# Patient Record
Sex: Female | Born: 1970 | Race: White | Hispanic: No | Marital: Married | Smoking: Never smoker
Health system: Southern US, Community
[De-identification: ages and names within clinical notes are randomized; demographics above are authoritative.]

## PROBLEM LIST (undated history)

## (undated) DIAGNOSIS — I10 Essential (primary) hypertension: Secondary | ICD-10-CM

## (undated) DIAGNOSIS — N200 Calculus of kidney: Secondary | ICD-10-CM

---

## 2002-04-04 ENCOUNTER — Other Ambulatory Visit: Admission: RE | Admit: 2002-04-04 | Discharge: 2002-04-04 | Payer: Self-pay | Admitting: Internal Medicine

## 2002-04-08 ENCOUNTER — Encounter: Payer: Self-pay | Admitting: Internal Medicine

## 2002-04-08 ENCOUNTER — Encounter: Admission: RE | Admit: 2002-04-08 | Discharge: 2002-04-08 | Payer: Self-pay | Admitting: Internal Medicine

## 2003-08-19 ENCOUNTER — Other Ambulatory Visit: Admission: RE | Admit: 2003-08-19 | Discharge: 2003-08-19 | Payer: Self-pay | Admitting: Obstetrics and Gynecology

## 2006-02-14 ENCOUNTER — Ambulatory Visit (HOSPITAL_COMMUNITY): Admission: RE | Admit: 2006-02-14 | Discharge: 2006-02-14 | Payer: Self-pay | Admitting: Obstetrics and Gynecology

## 2006-12-04 ENCOUNTER — Inpatient Hospital Stay (HOSPITAL_COMMUNITY): Admission: AD | Admit: 2006-12-04 | Discharge: 2006-12-04 | Payer: Self-pay | Admitting: *Deleted

## 2006-12-05 ENCOUNTER — Inpatient Hospital Stay (HOSPITAL_COMMUNITY): Admission: RE | Admit: 2006-12-05 | Discharge: 2006-12-07 | Payer: Self-pay | Admitting: Obstetrics & Gynecology

## 2008-01-30 ENCOUNTER — Encounter (INDEPENDENT_AMBULATORY_CARE_PROVIDER_SITE_OTHER): Payer: Self-pay | Admitting: Obstetrics & Gynecology

## 2008-01-30 ENCOUNTER — Ambulatory Visit (HOSPITAL_COMMUNITY): Admission: RE | Admit: 2008-01-30 | Discharge: 2008-01-30 | Payer: Self-pay | Admitting: Obstetrics & Gynecology

## 2009-04-09 ENCOUNTER — Inpatient Hospital Stay (HOSPITAL_COMMUNITY): Admission: AD | Admit: 2009-04-09 | Discharge: 2009-04-09 | Payer: Self-pay | Admitting: Obstetrics & Gynecology

## 2009-04-10 ENCOUNTER — Inpatient Hospital Stay (HOSPITAL_COMMUNITY): Admission: AD | Admit: 2009-04-10 | Discharge: 2009-04-10 | Payer: Self-pay | Admitting: Obstetrics and Gynecology

## 2009-05-26 ENCOUNTER — Ambulatory Visit: Payer: Self-pay | Admitting: Obstetrics & Gynecology

## 2009-05-26 ENCOUNTER — Inpatient Hospital Stay (HOSPITAL_COMMUNITY): Admission: AD | Admit: 2009-05-26 | Discharge: 2009-05-29 | Payer: Self-pay | Admitting: Obstetrics and Gynecology

## 2011-01-28 LAB — CBC
HCT: 36.5 % (ref 36.0–46.0)
HCT: 38.8 % (ref 36.0–46.0)
Hemoglobin: 10.5 g/dL — ABNORMAL LOW (ref 12.0–15.0)
Hemoglobin: 12.6 g/dL (ref 12.0–15.0)
MCV: 95.3 fL (ref 78.0–100.0)
MCV: 96 fL (ref 78.0–100.0)
Platelets: 161 10*3/uL (ref 150–400)
Platelets: 200 10*3/uL (ref 150–400)
RBC: 3.83 MIL/uL — ABNORMAL LOW (ref 3.87–5.11)
RDW: 14 % (ref 11.5–15.5)
RDW: 14.2 % (ref 11.5–15.5)
WBC: 11.7 10*3/uL — ABNORMAL HIGH (ref 4.0–10.5)

## 2011-01-28 LAB — COMPREHENSIVE METABOLIC PANEL
AST: 23 U/L (ref 0–37)
BUN: 5 mg/dL — ABNORMAL LOW (ref 6–23)
CO2: 22 mEq/L (ref 19–32)
Chloride: 108 mEq/L (ref 96–112)
Creatinine, Ser: 0.7 mg/dL (ref 0.4–1.2)
GFR calc Af Amer: >60 mL/min (ref >60)
GFR calc non Af Amer: >60 mL/min (ref >60)
Total Bilirubin: 0.6 mg/dL (ref 0.3–1.2)

## 2011-01-28 LAB — URIC ACID: Uric Acid, Serum: 5.1 mg/dL (ref 2.4–7.0)

## 2011-01-30 LAB — URINE CULTURE: Colony Count: 8000

## 2011-03-07 NOTE — Op Note (Signed)
[name], Jo Hudson              ACCOUNT NO.:  [id number]   MEDICAL RECORD NO.:  [id number]          PATIENT TYPE:  AMB   LOCATION:  SDC                           FACILITY:  [facility]   PHYSICIAN:  Genia Del, M.D.DATE OF BIRTH:  12/25/1970   DATE OF PROCEDURE:  01/30/2008  DATE OF DISCHARGE:                               OPERATIVE REPORT   PREOPERATIVE DIAGNOSIS:  14+ weeks gestation, fetal demise.   POSTOPERATIVE DIAGNOSIS:  14+ weeks gestation, fetal demise.   PROCEDURE:  Dilatation and extraction under ultrasound guidance.   SURGEON:  Genia Del, M.D.   ASSISTANT:  None.   ANESTHESIOLOGIST:  Raul Del, M.D.   PROCEDURE:  Under general anesthesia with endotracheal intubation, the  patient is in lithotomy position.  She is prepped with Betadine on the  abdominal and suprapubic areas and draped as usual.  The bladder is  catheterized.  The vaginal exam reveals an anteverted uterus about 14-15  cm, mobile, no adnexal mass.  The cervix is long and closed.  No vaginal  bleeding.  The speculum is introduced into the vagina.  The anterior lip  of the cervix is grasped with a tenaculum.  A paracervical block is done  with lidocaine 1%, 20 mL total, at 4 and 8 o'clock.  The patient has  received cefotetan 2 grams IV.  We remove the two laminaria and the  sponges.  We verify the dilatation of the cervix, and it is dilated up  to 2 cm already.  The largest dilator passes in and out easily.  We  therefore use a #16 curved suction curette and suction the intrauterine  cavity.  Products of conception are sent both to genetics for  chromosomes and FISH and pathology.  Most of the products of conception  are removed that way.  The fetal parts are counted and complete after  the procedure.  We use the ultrasound to verify the intrauterine cavity.  The line is thin.  No fetal parts or products of conception are left in.  We still use the sharp curette to assure that the  cavity is well emptied  and then go back to remove any blood clots with the suction again.  The  uterus contracts well.  Hemostasis is adequate.  All instruments are  removed.  The estimated blood loss was 100 mL.  The count of instruments  and sponges was complete.   The patient was brought to the recovery room in good stable status.      Genia Del, M.D.  Electronically Signed     ML/MEDQ  D:  01/30/2008  T:  01/30/2008  Job:  [id number]

## 2011-07-18 LAB — CBC
HCT: 38.8
MCHC: 35.8
MCV: 89.5
Platelets: 218
RDW: 13.5

## 2013-03-21 ENCOUNTER — Emergency Department (HOSPITAL_COMMUNITY)
Admission: EM | Admit: 2013-03-21 | Discharge: 2013-03-21 | Disposition: A | Discharge status: Home / Self Care | Payer: BC Managed Care – PPO | Attending: Emergency Medicine | Admitting: Emergency Medicine

## 2013-03-21 ENCOUNTER — Emergency Department (HOSPITAL_COMMUNITY): Payer: BC Managed Care – PPO

## 2013-03-21 ENCOUNTER — Encounter (HOSPITAL_COMMUNITY): Payer: Self-pay | Admitting: Emergency Medicine

## 2013-03-21 DIAGNOSIS — F411 Generalized anxiety disorder: Secondary | ICD-10-CM | POA: Insufficient documentation

## 2013-03-21 DIAGNOSIS — Z87442 Personal history of urinary calculi: Secondary | ICD-10-CM | POA: Insufficient documentation

## 2013-03-21 DIAGNOSIS — R209 Unspecified disturbances of skin sensation: Secondary | ICD-10-CM | POA: Insufficient documentation

## 2013-03-21 DIAGNOSIS — I10 Essential (primary) hypertension: Secondary | ICD-10-CM | POA: Insufficient documentation

## 2013-03-21 DIAGNOSIS — F419 Anxiety disorder, unspecified: Secondary | ICD-10-CM

## 2013-03-21 DIAGNOSIS — R079 Chest pain, unspecified: Secondary | ICD-10-CM

## 2013-03-21 DIAGNOSIS — R0789 Other chest pain: Secondary | ICD-10-CM | POA: Insufficient documentation

## 2013-03-21 HISTORY — DX: Calculus of kidney: N20.0

## 2013-03-21 HISTORY — DX: Essential (primary) hypertension: I10

## 2013-03-21 LAB — COMPREHENSIVE METABOLIC PANEL
ALT: 12 U/L (ref 0–35)
AST: 18 U/L (ref 0–37)
Calcium: 9.5 mg/dL (ref 8.4–10.5)
Creatinine, Ser: 0.78 mg/dL (ref 0.50–1.10)
GFR calc Af Amer: >90 mL/min (ref >90)
Sodium: 139 mEq/L (ref 135–145)
Total Protein: 8.2 g/dL (ref 6.0–8.3)

## 2013-03-21 LAB — CBC
MCH: 32.3 pg (ref 26.0–34.0)
MCHC: 35.9 g/dL (ref 30.0–36.0)
Platelets: 204 10*3/uL (ref 150–400)

## 2013-03-21 LAB — POCT I-STAT TROPONIN I: Troponin i, poc: 0 ng/mL (ref 0.00–0.08)

## 2013-03-21 MED ORDER — NITROGLYCERIN 0.4 MG SL SUBL
0.4000 mg | SUBLINGUAL_TABLET | SUBLINGUAL | Status: DC | PRN
  Administered 2013-03-21: 0.4 mg via SUBLINGUAL

## 2013-03-21 MED ORDER — HYDROXYZINE HCL 25 MG PO TABS: 25.0000 mg | ORAL_TABLET | Freq: Four times a day (QID) | ORAL | Status: DC | PRN

## 2013-03-21 MED ORDER — ASPIRIN 81 MG PO CHEW
324.0000 mg | CHEWABLE_TABLET | Freq: Once | ORAL | Status: AC
  Administered 2013-03-21: 324 mg via ORAL
  Filled 2013-03-21: qty 4

## 2013-03-21 MED ORDER — LORAZEPAM 2 MG/ML IJ SOLN
1.0000 mg | Freq: Once | INTRAMUSCULAR | Status: AC
  Administered 2013-03-21: 1 mg via INTRAVENOUS
  Filled 2013-03-21: qty 1

## 2013-03-21 MED ORDER — LABETALOL HCL 5 MG/ML IV SOLN
20.0000 mg | Freq: Once | INTRAVENOUS | Status: AC
  Administered 2013-03-21: 20 mg via INTRAVENOUS
  Filled 2013-03-21: qty 4

## 2013-03-21 MED ORDER — LISINOPRIL 10 MG PO TABS
10.0000 mg | ORAL_TABLET | Freq: Once | ORAL | Status: AC
  Administered 2013-03-21: 10 mg via ORAL
  Filled 2013-03-21: qty 1

## 2013-03-21 MED ORDER — LISINOPRIL 10 MG PO TABS: 20.0000 mg | ORAL_TABLET | Freq: Every day | ORAL | Status: DC

## 2013-03-21 NOTE — ED Notes (Signed)
Cp and numbness in arms both x a couple of days no nausea or sob

## 2013-03-21 NOTE — ED Notes (Signed)
Pt sts feeling pressure in her throat- sts she gets it when anxious. Airway intact. No difficulty swallowing. Pt HR 123

## 2013-03-21 NOTE — ED Notes (Signed)
Has not been taking her bp meds she staes

## 2013-03-21 NOTE — ED Provider Notes (Signed)
History     CSN: [id number]  Arrival date & time 03/21/13  1111   First MD Initiated Contact with Patient 03/21/13 1121      Chief Complaint  Patient presents with  . Chest Pain    (Consider location/radiation/quality/duration/timing/severity/associated sxs/prior treatment) HPI Comments: Patient comes to the ER for evaluation of chest pain and numbness and tingling in her arms and legs that began 5 days ago. She said she thought she heard a crack pipe carrying a heavy bag the day before. She says that the symptoms have been coming and going. The numbness and tingling in her arms and legs feels like pins and needles. She says that these symptoms come and go. She has been having intermittent pain in the center for test as well. She hasn't noticed any specific shortness of breath. There is no nausea or vomiting. Patient reports that she is very anxious. She has not noticed any specific exacerbating or alleviating factors.  Patient is a 42 y.o. female presenting with chest pain.  Chest Pain Associated symptoms: numbness   Associated symptoms: no shortness of breath     Past Medical History  Diagnosis Date  . Kidney stones   . Hypertension     No past surgical history on file.  No family history on file.  History  Substance Use Topics  . Smoking status: Never Smoker   . Smokeless tobacco: Not on file  . Alcohol Use: Yes    OB History   Grav Para Term Preterm Abortions TAB SAB Ect Mult Living                  Review of Systems  Respiratory: Negative for shortness of breath.   Cardiovascular: Positive for chest pain.  Neurological: Positive for numbness.  Psychiatric/Behavioral: The patient is nervous/anxious.   All other systems reviewed and are negative.    Allergies  Review of patient's allergies indicates no known allergies.  Home Medications  No current outpatient prescriptions on file.  BP 226/140  Pulse 131  Resp 16  SpO2 99%  Physical Exam   Constitutional: She is oriented to person, place, and time. She appears well-developed and well-nourished. No distress.  HENT:  Head: Normocephalic and atraumatic.  Right Ear: Hearing normal.  Left Ear: Hearing normal.  Nose: Nose normal.  Mouth/Throat: Oropharynx is clear and moist and mucous membranes are normal.  Eyes: Conjunctivae and EOM are normal. Pupils are equal, round, and reactive to light.  Neck: Normal range of motion. Neck supple.  Cardiovascular: Regular rhythm, S1 normal and S2 normal.  Exam reveals no gallop and no friction rub.   No murmur heard. Pulmonary/Chest: Effort normal and breath sounds normal. No respiratory distress. She exhibits no tenderness.  Abdominal: Soft. Normal appearance and bowel sounds are normal. There is no hepatosplenomegaly. There is no tenderness. There is no rebound, no guarding, no tenderness at McBurney's point and negative Murphy's sign. No hernia.  Musculoskeletal: Normal range of motion.  Neurological: She is alert and oriented to person, place, and time. She has normal strength. No cranial nerve deficit or sensory deficit. Coordination normal. GCS eye subscore is 4. GCS verbal subscore is 5. GCS motor subscore is 6.  Skin: Skin is warm, dry and intact. No rash noted. No cyanosis.  Psychiatric: Her speech is normal and behavior is normal. Thought content normal. Her mood appears anxious.    ED Course  Procedures (including critical care time)  EKG #1:  Date: 03/21/2013  Rate: 135  Rhythm: sinus tachycardia  QRS Axis: left  Intervals: normal  ST/T Wave abnormalities: normal  Conduction Disutrbances:none  Narrative Interpretation:   Old EKG Reviewed: none available  EKG#2:  Date: 03/21/2013  Rate: 85  Rhythm: normal sinus rhythm  QRS Axis: left  Intervals: normal  ST/T Wave abnormalities: normal  Conduction Disutrbances:none  Narrative Interpretation:   Old EKG Reviewed: tachycardia resolved      Labs Reviewed  CBC -  Abnormal; Notable for the following:    Hemoglobin 16.2 (*)    All other components within normal limits  COMPREHENSIVE METABOLIC PANEL - Abnormal; Notable for the following:    Potassium 3.2 (*)    Glucose, Bld 108 (*)    Alkaline Phosphatase 131 (*)    All other components within normal limits  D-DIMER, QUANTITATIVE  POCT I-STAT TROPONIN I   Dg Chest Portable 1 View  03/21/2013   *RADIOLOGY REPORT*  Clinical Data: Left upper chest pain, lifting injury  CHEST - 1 VIEW  Comparison:  None.  Findings: The heart size and mediastinal contours are within normal limits.  Both lungs are clear.  IMPRESSION: No active disease.   Original Report Authenticated By: Judie Petit. Miles Costain, M.D.     Diagnosis: 1. Uncontrolled hypertension 2. Chest pain 2. Anxiety    MDM  Patient presents to the ER for evaluation of chest pain. Patient has been experiencing intermittent episodes of pain in her chest for 5 days. This has also been associated with numbness, tingling and pain in all of her extremities. There is no unilateral neurologic symptomatology. Patient has a normal neurologic exam, no concern for CVA.  Patient does have a history of hypertension that is currently untreated. She was previously on Prinivil. She presented with significantly elevated blood pressure of 226/140. The patient was very anxious panel. I believe some of her paresthesias are secondary to hyperventilation. Patient was administered nitroglycerin for her blood pressure and Ativan to help with blood pressure as well as anxiety. After these medications pressure was much improved but still elevated. Blood pressure continued to improve after IV labetalol. Repeat evaluation reveals that the patient is currently symptom-free. Repeat EKG shows no evidence of ischemia or infarct, tachycardia has resolved. Patient does report that she has a primary care doctor. Her doctor has left the office, but she can still go back to the office for repeat evaluation. She  will restart her Prinivil and have followup with primary care doctor for a repeat check of blood pressure and further consideration these issues. Return to ER if symptoms worsen.        Gilda Crease, MD 03/21/13 1434

## 2013-03-21 NOTE — ED Notes (Signed)
Portable at bedside 

## 2013-06-30 ENCOUNTER — Emergency Department (HOSPITAL_COMMUNITY)
Admission: EM | Admit: 2013-06-30 | Discharge: 2013-06-30 | Disposition: A | Discharge status: Home / Self Care | Payer: BC Managed Care – PPO | Attending: Emergency Medicine | Admitting: Emergency Medicine

## 2013-06-30 ENCOUNTER — Encounter (HOSPITAL_COMMUNITY): Payer: Self-pay | Admitting: *Deleted

## 2013-06-30 ENCOUNTER — Emergency Department (HOSPITAL_COMMUNITY): Payer: BC Managed Care – PPO

## 2013-06-30 DIAGNOSIS — Z87442 Personal history of urinary calculi: Secondary | ICD-10-CM | POA: Insufficient documentation

## 2013-06-30 DIAGNOSIS — Z79899 Other long term (current) drug therapy: Secondary | ICD-10-CM | POA: Insufficient documentation

## 2013-06-30 DIAGNOSIS — S93409A Sprain of unspecified ligament of unspecified ankle, initial encounter: Secondary | ICD-10-CM | POA: Insufficient documentation

## 2013-06-30 DIAGNOSIS — S92309A Fracture of unspecified metatarsal bone(s), unspecified foot, initial encounter for closed fracture: Secondary | ICD-10-CM | POA: Insufficient documentation

## 2013-06-30 DIAGNOSIS — S92301A Fracture of unspecified metatarsal bone(s), right foot, initial encounter for closed fracture: Secondary | ICD-10-CM

## 2013-06-30 DIAGNOSIS — Y9289 Other specified places as the place of occurrence of the external cause: Secondary | ICD-10-CM | POA: Insufficient documentation

## 2013-06-30 DIAGNOSIS — I10 Essential (primary) hypertension: Secondary | ICD-10-CM | POA: Insufficient documentation

## 2013-06-30 DIAGNOSIS — W108XXA Fall (on) (from) other stairs and steps, initial encounter: Secondary | ICD-10-CM | POA: Insufficient documentation

## 2013-06-30 DIAGNOSIS — S93402A Sprain of unspecified ligament of left ankle, initial encounter: Secondary | ICD-10-CM

## 2013-06-30 DIAGNOSIS — Y9389 Activity, other specified: Secondary | ICD-10-CM | POA: Insufficient documentation

## 2013-06-30 NOTE — ED Notes (Signed)
Pt states she missed a step on her deck this morning and fell, complaining of R ankle pain and L lower leg pain, pt denies hitting head or LOC.

## 2013-06-30 NOTE — ED Provider Notes (Signed)
CSN: [id number]     Arrival date & time 06/30/13  9629 History   First MD Initiated Contact with Patient 06/30/13 [phone]     Chief Complaint  Patient presents with  . Fall  . Ankle Injury    right  . Leg Injury    left   (Consider location/radiation/quality/duration/timing/severity/associated sxs/prior Treatment) HPI Comments: 42 year old female presents the emergency department complaining of right foot, left ankle and left leg pain after missing a step on her deck around 5:45 this morning causing her to fall. Denies hitting her head or loss of consciousness. Pain described as throbbing, worse with pressure or walking, relieved by sitting. She tried taking ibuprofen with some relief. States there is swelling on her right foot and left ankle. Denies numbness or tingling.  Patient is a 42 y.o. female presenting with fall and lower extremity injury. The history is provided by the patient.  Fall Pertinent negatives include no numbness.  Ankle Injury Pertinent negatives include no numbness.    Past Medical History  Diagnosis Date  . Kidney stones   . Hypertension    History reviewed. No pertinent past surgical history. No family history on file. History  Substance Use Topics  . Smoking status: Never Smoker   . Smokeless tobacco: Never Used  . Alcohol Use: Yes   OB History   Grav Para Term Preterm Abortions TAB SAB Ect Mult Living                 Review of Systems  Musculoskeletal:       Positive for right foot, left ankle and leg pain.  Skin: Negative for color change.  Neurological: Negative for numbness.  All other systems reviewed and are negative.    Allergies  Review of patient's allergies indicates no known allergies.  Home Medications   Current Outpatient Rx  Name  Route  Sig  Dispense  Refill  . hydrOXYzine (ATARAX/VISTARIL) 25 MG tablet   Oral   Take 1 tablet (25 mg total) by mouth every 6 (six) hours as needed for anxiety.   20 tablet   0   . ibuprofen  (ADVIL,MOTRIN) 200 MG tablet   Oral   Take 800 mg by mouth every 8 (eight) hours as needed for pain.          Marland Kitchen lisinopril (PRINIVIL,ZESTRIL) 10 MG tablet   Oral   Take 2 tablets (20 mg total) by mouth daily.   30 tablet   0    BP 157/95  Pulse 78  Temp(Src) 98.9 F (37.2 C) (Oral)  Resp 18  SpO2 96%  LMP 06/29/2013 Physical Exam  Nursing note and vitals reviewed. Constitutional: She is oriented to person, place, and time. She appears well-developed and well-nourished. No distress.  HENT:  Head: Normocephalic and atraumatic.  Mouth/Throat: Oropharynx is clear and moist.  Eyes: Conjunctivae are normal.  Neck: Normal range of motion. Neck supple.  Cardiovascular: Normal rate, regular rhythm and normal heart sounds.   Pulses:      Dorsalis pedis pulses are 2+ on the right side, and 2+ on the left side.       Posterior tibial pulses are 2+ on the right side, and 2+ on the left side.  Capillary refill less than 3 seconds.  Pulmonary/Chest: Effort normal and breath sounds normal.  Musculoskeletal: Normal range of motion. She exhibits no edema.  Tender to palpation over the right fourth and fifth metatarsals with mild swelling. No bruising. No right ankle tenderness or  swelling. Full range of motion of right ankle. Tender to palpation over L AITFL with swelling noted laterally. Decreased range of motion of left ankle with flexion and extension limited by pain. Tender to palpation over proximal fibula.  Neurological: She is alert and oriented to person, place, and time.  Skin: Skin is warm and dry. No bruising and no ecchymosis noted. She is not diaphoretic.  Psychiatric: She has a normal mood and affect. Her behavior is normal.    ED Course  Procedures (including critical care time) Labs Review Labs Reviewed - No data to display Imaging Review Dg Tibia/fibula Left  06/30/2013   *RADIOLOGY REPORT*  Clinical Data: Recent traumatic injury with pain  LEFT TIBIA AND FIBULA - 2 VIEW   Comparison: None.  Findings: No acute fracture or dislocation is noted.  No gross soft tissue abnormality is seen.  IMPRESSION: No acute abnormality noted.   Original Report Authenticated By: Alcide Clever, M.D.   Dg Ankle Complete Left  06/30/2013   *RADIOLOGY REPORT*  Clinical Data: Recent leg injury with lateral ankle pain  LEFT ANKLE COMPLETE - 3+ VIEW  Comparison: None.  Findings: Mild soft tissue swelling is noted laterally.  No acute fracture or dislocation is noted.  A small plantar calcaneal spur is seen.  IMPRESSION: No acute abnormality is noted.   Original Report Authenticated By: Alcide Clever, M.D.   Dg Foot Complete Right  06/30/2013   *RADIOLOGY REPORT*  Clinical Data: Recent injury with right foot pain  RIGHT FOOT COMPLETE - 3+ VIEW  Comparison: None.  Findings: There is a mildly displaced fracture of the distal aspect of the fifth metatarsal.  No other fractures are identified.  Mild soft tissue swelling is noted as well.  IMPRESSION: Right fifth metatarsal fracture distally.   Original Report Authenticated By: Alcide Clever, M.D.    MDM   1. Metatarsal fracture, right, closed, initial encounter   2. Ankle sprain, left, initial encounter    Patient with right 5th metatarsal fracture, left ankle sprain. Neurovascularly intact bilaterally. Cam Walker for right foot, Ace wrap for left. Ibuprofen for pain/swelling, patient does not want any stronger pain control. Followup with [facility] orthopedics as she has seen them in the past. Return precautions discussed. Patient states understanding of treatment care plan and is agreeable.     Trevor Mace, PA-C 06/30/13 1105

## 2013-07-09 NOTE — ED Provider Notes (Signed)
Medical screening examination/treatment/procedure(s) were performed by non-physician practitioner and as supervising physician I was immediately available for consultation/collaboration.   Claudean Kinds, MD 07/09/13 [phone]

## 2013-08-28 ENCOUNTER — Other Ambulatory Visit: Payer: Self-pay

## 2013-12-03 ENCOUNTER — Ambulatory Visit (INDEPENDENT_AMBULATORY_CARE_PROVIDER_SITE_OTHER): Payer: BC Managed Care – PPO

## 2013-12-03 VITALS — BP 143/85 | HR 98 | Resp 18

## 2013-12-03 DIAGNOSIS — IMO0002 Reserved for concepts with insufficient information to code with codable children: Secondary | ICD-10-CM

## 2013-12-03 DIAGNOSIS — M79609 Pain in unspecified limb: Secondary | ICD-10-CM

## 2013-12-03 DIAGNOSIS — S92309A Fracture of unspecified metatarsal bone(s), unspecified foot, initial encounter for closed fracture: Secondary | ICD-10-CM

## 2013-12-03 DIAGNOSIS — S42309D Unspecified fracture of shaft of humerus, unspecified arm, subsequent encounter for fracture with routine healing: Secondary | ICD-10-CM

## 2013-12-03 NOTE — Patient Instructions (Signed)
ICE INSTRUCTIONS  Apply ice or cold pack to the affected area at least 3 times a day for 10-15 minutes each time.  You should also use ice after prolonged activity or vigorous exercise.  Do not apply ice longer than 20 minutes at one time.  Always keep a cloth between your skin and the ice pack to prevent burns.  Being consistent and following these instructions will help control your symptoms.  We suggest you purchase a gel ice pack because they are reusable and do bit leak.  Some of them are designed to wrap around the area.  Use the method that works best for you.  Here are some other suggestions for icing.   Use a frozen bag of peas or corn-inexpensive and molds well to your body, usually stays frozen for 10 to 20 minutes.  Wet a towel with cold water and squeeze out the excess until it's damp.  Place in a bag in the freezer for 20 minutes. Then remove and use.  Actually recommended alternating hot and cold therapy apply a warm or hot compresses for 15 minutes then ice pack for 15 minutes repeat this process 2 or 3 times once or twice every day. This stimulates increased blood flow to the area which can also stimulate healing to improve.  Also maintain a stiff soled walking or athletic shoe consider things like a dance toe clogs or new balance or Brooks running shoes with a thick inflexible sole

## 2013-12-03 NOTE — Progress Notes (Signed)
Subjective:    Patient ID: Jo Hudson, female    DOB: 1970/12/12, 43 y.o.   MRN: [id number]  HPI my right foot hurts on the side and has been going since 06-30-13 and missed the last step  and I was seeing [facility]  and they did x-ray and put me in a boot and went to regular doctor and did all the testing and they wanted to put me in a bone stimulator and sore and tender and aches at times and not much swelling    Review of Systems  All other systems reviewed and are negative.       Objective:   Physical Exam Lower extremity objective findings as follows vascular status appears to be intact with pedal pulses palpable DP and PT +2/4 bilateral. Capillary fill time 3-4 seconds all digits skin temperature warm turgor normal there is no edema rubor pallor or varicosities noted. Neurologically epicritic and proprioceptive sensations appear to be intact and symmetric bilateral patient does describe still aching and pain along the fifth metatarsal phalangeal joint area right foot. Orthopedic biomechanical exam reveals rectus foot type mild adductovarus rotation lesser digit contractures there is no pain on the left foot our right foot has some tenderness along the fifth metatarsal neck and head area somewhat is base but not at the styloid process is free of pain. X-rays at this time demonstrate A. Oblique fracture of the metatarsal neck fifth metatarsal. There appears to be some proximal telescoping and minimal shifting over the fracture line appears to be consolidating well there may be some still mild lucency of the fracture site however I do feel it is consolidating although a delayed fashion. Its been more than 4 months since patient's injury patient indicates shoe or boot for 3-4 of those months. She also indicated she was offered a bone stimulator which she declined our wondered if you would be helpful at this time. Also on is any other things that can be done to help healing process.  She has seen has not been put back in a cast which I declined to do at this time as I feel that further create osteopenic change and increase her risks.       Assessment & Plan:  Assessment this time is metatarsal fracture fifth metatarsal right foot with possible delayed union. I do feel there is consolidation probably 70% at this point there is minimal bone callus although there is some proximal shifting or splaying of the metatarsal fracture site. There is no severe rotation component of the capital fragment no significant elevation lateral oblique projections noted I do not see much displacement of the proximal contracture. There still some slight tenderness both on palpation around the fifth met head and some dorsal flexion plantar flexion of the fifth toe. Back her recommendation at this time is to maintain a stiff soled walking or athletic shoe no barefoot no flimsy shoes or flip-flops recommend warm compresses and ice packs to help with the edema and a stability more blood flow to the area to heal bone moderate moderate walking activities are recommended at this time a stiff soled shoe I do not think additional immobilization is necessary. I suggested a bone stimulator may be helpful although at this point she is significantly improved and likely will continue to improve even without bone stimulator at this time. If there is any new exacerbations increasing pain or swelling followup with additional x-rays. Patient is advised the only definitive way to determine adequate healing  would be a CT scan or MRI scan which I suggested she should likely ski permanently and discontinue on a course of gentle protected ambulation and slowly increasing her activities. Also recommended vitamin D and calcium supplementation which can help with bone healing as well.  Harriet Masson DPM

## 2015-11-18 ENCOUNTER — Emergency Department (HOSPITAL_COMMUNITY)
Admission: EM | Admit: 2015-11-18 | Discharge: 2015-11-18 | Disposition: A | Discharge status: Home / Self Care | Payer: BLUE CROSS/BLUE SHIELD | Attending: Emergency Medicine | Admitting: Emergency Medicine

## 2015-11-18 ENCOUNTER — Encounter (HOSPITAL_COMMUNITY): Payer: Self-pay | Admitting: Emergency Medicine

## 2015-11-18 DIAGNOSIS — Y998 Other external cause status: Secondary | ICD-10-CM | POA: Insufficient documentation

## 2015-11-18 DIAGNOSIS — T23002A Burn of unspecified degree of left hand, unspecified site, initial encounter: Secondary | ICD-10-CM | POA: Diagnosis present

## 2015-11-18 DIAGNOSIS — Y9389 Activity, other specified: Secondary | ICD-10-CM | POA: Insufficient documentation

## 2015-11-18 DIAGNOSIS — I10 Essential (primary) hypertension: Secondary | ICD-10-CM | POA: Diagnosis not present

## 2015-11-18 DIAGNOSIS — Z87442 Personal history of urinary calculi: Secondary | ICD-10-CM | POA: Diagnosis not present

## 2015-11-18 DIAGNOSIS — Y9289 Other specified places as the place of occurrence of the external cause: Secondary | ICD-10-CM | POA: Diagnosis not present

## 2015-11-18 DIAGNOSIS — T23102A Burn of first degree of left hand, unspecified site, initial encounter: Secondary | ICD-10-CM | POA: Diagnosis not present

## 2015-11-18 DIAGNOSIS — Z79899 Other long term (current) drug therapy: Secondary | ICD-10-CM | POA: Insufficient documentation

## 2015-11-18 DIAGNOSIS — T300 Burn of unspecified body region, unspecified degree: Secondary | ICD-10-CM

## 2015-11-18 DIAGNOSIS — X12XXXA Contact with other hot fluids, initial encounter: Secondary | ICD-10-CM | POA: Insufficient documentation

## 2015-11-18 MED ORDER — SILVER SULFADIAZINE 1 % EX CREA
TOPICAL_CREAM | Freq: Once | CUTANEOUS | Status: AC
  Administered 2015-11-18: 1 via TOPICAL
  Filled 2015-11-18: qty 50
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
## 2015-11-18 NOTE — Discharge Instructions (Signed)
Burn Care °Your skin is a natural barrier to infection. It is the largest organ of your body. Burns damage this natural protection. To help prevent infection, it is very important to follow your caregiver's instructions in the care of your burn. °Burns are classified as: °· First degree. There is only redness of the skin (erythema). No scarring is expected. °· Second degree. There is blistering of the skin. Scarring may occur with deeper burns. °· Third degree. All layers of the skin are injured, and scarring is expected. °HOME CARE INSTRUCTIONS  °· Wash your hands well before changing your bandage. °· Change your bandage as often as directed by your caregiver. °¨ Remove the old bandage. If the bandage sticks, you may soak it off with cool, clean water. °¨ Cleanse the burn thoroughly but gently with mild soap and water. °¨ Pat the area dry with a clean, dry cloth. °¨ Apply a thin layer of antibacterial cream to the burn. °¨ Apply a clean bandage as instructed by your caregiver. °¨ Keep the bandage as clean and dry as possible. °· Elevate the affected area for the first 24 hours, then as instructed by your caregiver. °· Only take over-the-counter or prescription medicines for pain, discomfort, or fever as directed by your caregiver. °SEEK IMMEDIATE MEDICAL CARE IF:  °· You develop excessive pain. °· You develop redness, tenderness, swelling, or red streaks near the burn. °· The burned area develops yellowish-white fluid (pus) or a bad smell. °· You have a fever. °MAKE SURE YOU:  °· Understand these instructions. °· Will watch your condition. °· Will get help right away if you are not doing well or get worse. °  °This information is not intended to replace advice given to you by your health care provider. Make sure you discuss any questions you have with your health care provider. °  °Document Released: 10/09/2005 Document Revised: 01/01/2012 Document Reviewed: 03/01/2011 °Elsevier Interactive Patient Education ©2016  [employer] ° °

## 2015-11-18 NOTE — ED Provider Notes (Signed)
CSN: [id number]     Arrival date & time 11/18/15  4098 History   First MD Initiated Contact with Patient 11/18/15 1118     Chief Complaint  Patient presents with  . Hand Burn   HPI   45 year old female presents today with a burn to her left hand. Patient reports that on Sunday ( 5 days ago) she spilled hot chili on her hand. She reports at that time she just experience pain and redness, with subsequent development of clear fluid-filled esters over the next several days. She reports some initial swelling, which has improved day today. She denies any circumferential burn, inability to flex or extend fingers, worsening redness, signs of infection. She reports pain is manageable with over-the-counter medications, has been applying bacitracin. She denies any significant chronic medical history including diabetes, hypertension, skin disorders. No neurological deficits to the surrounding tissue.  Past Medical History  Diagnosis Date  . Kidney stones   . Hypertension    History reviewed. No pertinent past surgical history. No family history on file. Social History  Substance Use Topics  . Smoking status: Never Smoker   . Smokeless tobacco: Never Used  . Alcohol Use: Yes   OB History    No data available     Review of Systems  All other systems reviewed and are negative.     Allergies  Review of patient's allergies indicates no known allergies.  Home Medications   Prior to Admission medications   Medication Sig Start Date End Date Taking? Authorizing Provider  citalopram (CELEXA) 10 MG tablet Take 10 mg by mouth daily. 10/22/15  Yes Historical Provider, MD  ibuprofen (ADVIL,MOTRIN) 200 MG tablet Take 800 mg by mouth every 6 (six) hours as needed for moderate pain.   Yes Historical Provider, MD  lisinopril-hydrochlorothiazide (PRINZIDE,ZESTORETIC) 20-12.5 MG tablet Take 1 tablet by mouth daily. 09/01/15  Yes Historical Provider, MD  hydrOXYzine (ATARAX/VISTARIL) 25 MG tablet Take 1  tablet (25 mg total) by mouth every 6 (six) hours as needed for anxiety. Patient not taking: Reported on 11/18/2015 03/21/13   Gilda Crease, MD  lisinopril (PRINIVIL,ZESTRIL) 10 MG tablet Take 2 tablets (20 mg total) by mouth daily. Patient not taking: Reported on 11/18/2015 03/21/13   Gilda Crease, MD   BP 177/125 mmHg  Pulse 126  Temp(Src) 98.9 F (37.2 C) (Oral)  Resp 18  SpO2 100%  LMP 11/04/2015 Physical Exam  Constitutional: She is oriented to person, place, and time. She appears well-developed and well-nourished.  HENT:  Head: Normocephalic and atraumatic.  Eyes: Conjunctivae are normal. Pupils are equal, round, and reactive to light. Right eye exhibits no discharge. Left eye exhibits no discharge. No scleral icterus.  Neck: Normal range of motion. No JVD present. No tracheal deviation present.  Pulmonary/Chest: Effort normal. No stridor.  Neurological: She is alert and oriented to person, place, and time. Coordination normal.  Sensation intact to the distal extremities  Psychiatric: She has a normal mood and affect. Her behavior is normal. Judgment and thought content normal.  Nursing note and vitals reviewed.        ED Course  Procedures (including critical care time) Labs Review Labs Reviewed - No data to display  Imaging Review No results found. I have personally reviewed and evaluated these images and lab results as part of my medical decision-making.   EKG Interpretation None      MDM   Final diagnoses:  Burn    Labs:   Imaging:  Consults:  Therapeutics: Silvadene  Discharge Meds:   Assessment/Plan: Patient presents with burn to her left hand, this appears superficial, with subsequent blisters noted in the above picture. She has no signs of surrounding infection or cellulitis, has been keeping hand clean with triple antibiotics ointment. 70 cream applied here, she is instructed to continue using Silvadene at home. She is instructed  follow-up with the primary care provider in 2 days for reevaluation, sooner if any signs of infection occur. No circumferential burns, no significant swelling to the fingers, sensation intact. Patient here at the request of family members, she reports symptoms have been improving. She given strict return precautions, verbalized understanding and agreement to today's plan.         Eyvonne Mechanic, PA-C 11/18/15 1208  Mancel Bale, MD 11/19/15 [phone]

## 2018-04-24 ENCOUNTER — Encounter: Payer: BLUE CROSS/BLUE SHIELD | Admitting: Neurology

## 2018-06-03 ENCOUNTER — Telehealth: Payer: Self-pay | Admitting: Neurology

## 2018-06-03 ENCOUNTER — Encounter: Payer: Self-pay | Admitting: Neurology

## 2018-06-03 ENCOUNTER — Encounter: Payer: BLUE CROSS/BLUE SHIELD | Admitting: Neurology

## 2018-06-03 NOTE — Telephone Encounter (Signed)
This patient did not show for an EMG and nerve conduction study evaluation today. 

## 2018-07-17 ENCOUNTER — Other Ambulatory Visit: Payer: Self-pay | Admitting: Family Medicine

## 2018-07-17 DIAGNOSIS — R52 Pain, unspecified: Secondary | ICD-10-CM

## 2018-08-20 ENCOUNTER — Other Ambulatory Visit: Payer: Self-pay | Admitting: Family Medicine

## 2018-08-20 DIAGNOSIS — R1011 Right upper quadrant pain: Secondary | ICD-10-CM

## 2018-08-20 DIAGNOSIS — M79605 Pain in left leg: Secondary | ICD-10-CM

## 2018-08-20 DIAGNOSIS — M7989 Other specified soft tissue disorders: Secondary | ICD-10-CM

## 2018-08-21 ENCOUNTER — Other Ambulatory Visit: Payer: BLUE CROSS/BLUE SHIELD

## 2018-08-22 ENCOUNTER — Other Ambulatory Visit: Payer: BLUE CROSS/BLUE SHIELD

## 2018-08-23 ENCOUNTER — Ambulatory Visit
Admission: RE | Admit: 2018-08-23 | Discharge: 2018-08-23 | Disposition: A | Discharge status: Home / Self Care | Payer: BLUE CROSS/BLUE SHIELD | Source: Ambulatory Visit | Attending: Family Medicine | Admitting: Family Medicine

## 2018-08-23 DIAGNOSIS — M79605 Pain in left leg: Secondary | ICD-10-CM

## 2018-08-23 DIAGNOSIS — M7989 Other specified soft tissue disorders: Secondary | ICD-10-CM

## 2018-10-17 ENCOUNTER — Other Ambulatory Visit: Payer: Self-pay | Admitting: Family Medicine

## 2018-10-17 DIAGNOSIS — Z1231 Encounter for screening mammogram for malignant neoplasm of breast: Secondary | ICD-10-CM

## 2019-08-13 IMAGING — US US EXTREM LOW VENOUS*L*
1 series · 13 of 24 positions shown · non-contrast
Comparison: None.

CLINICAL DATA: Left lower extremity pain and edema for the past 2
weeks. History of broken ankle in 0281. Evaluate for DVT.



[Series 1: us extrem low venous*left* · 0.08mm/px · 13 of 28 slices shown]
[im 1/28]
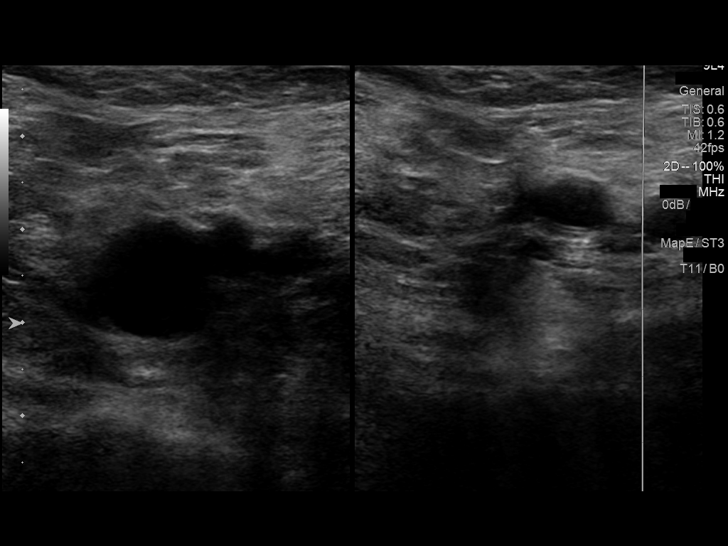
[im 3/28]
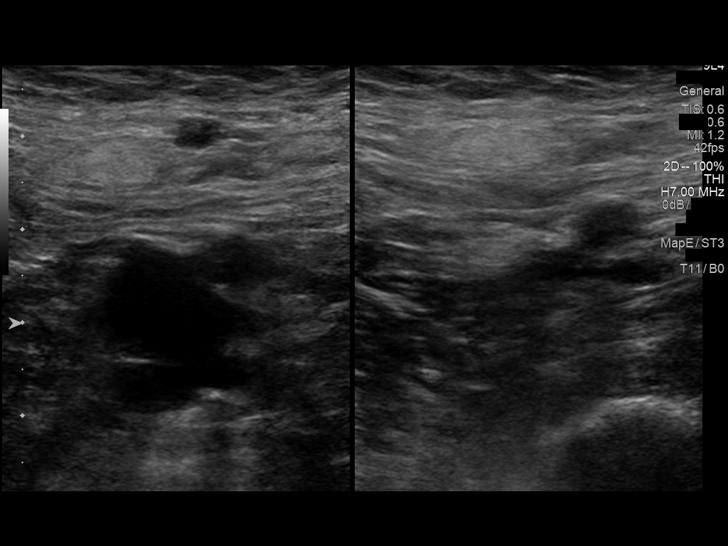
[im 5/28]
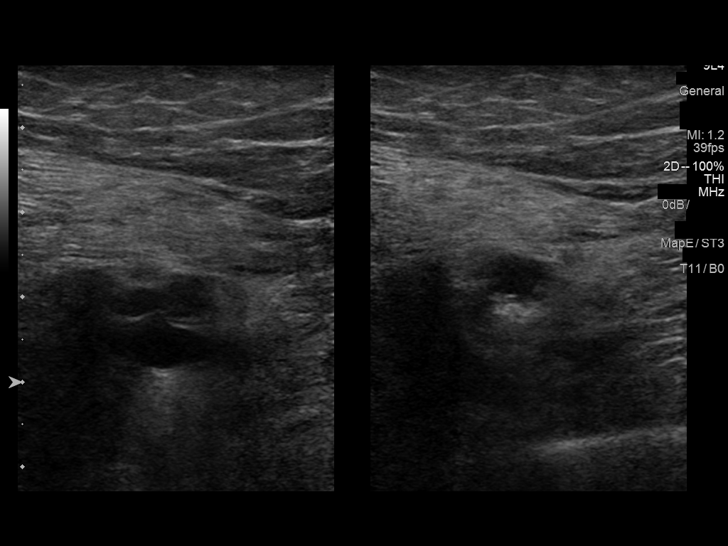
[im 8/28]
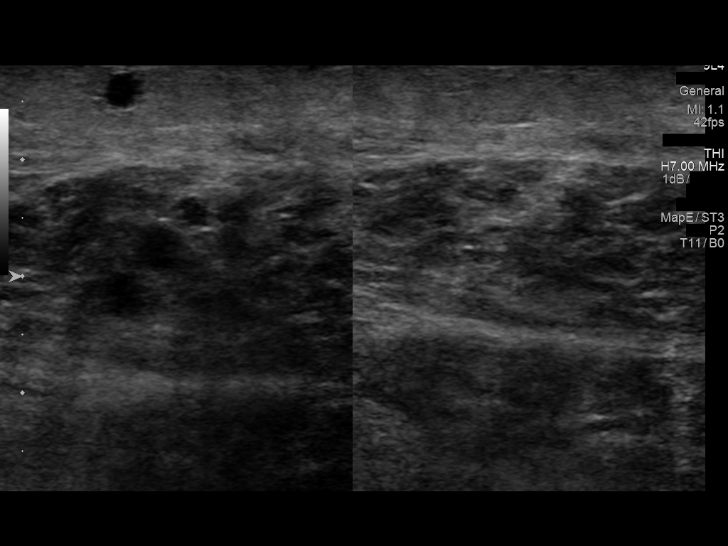
[im 10/28]
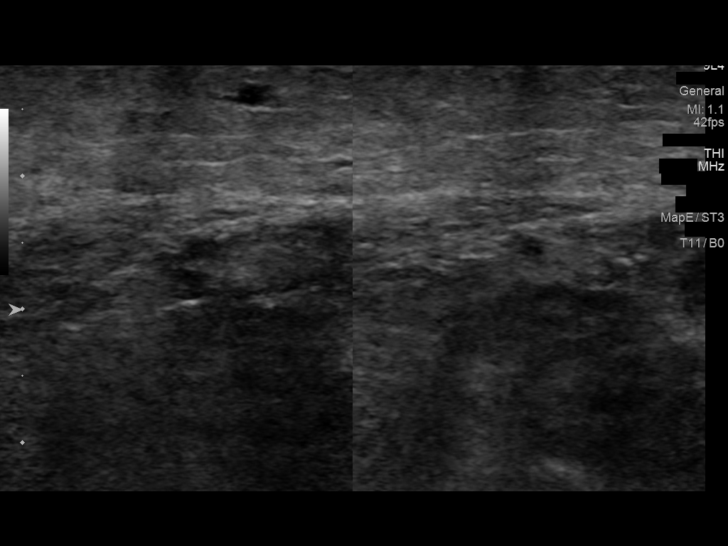
[im 12/28]
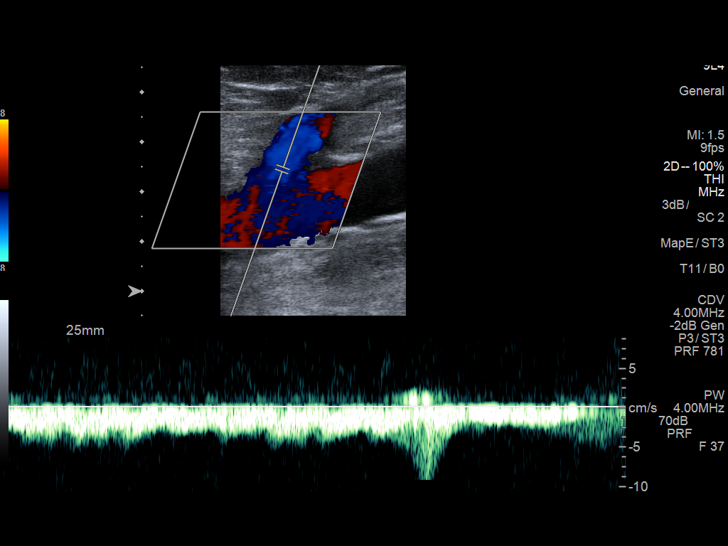
[im 15/28]
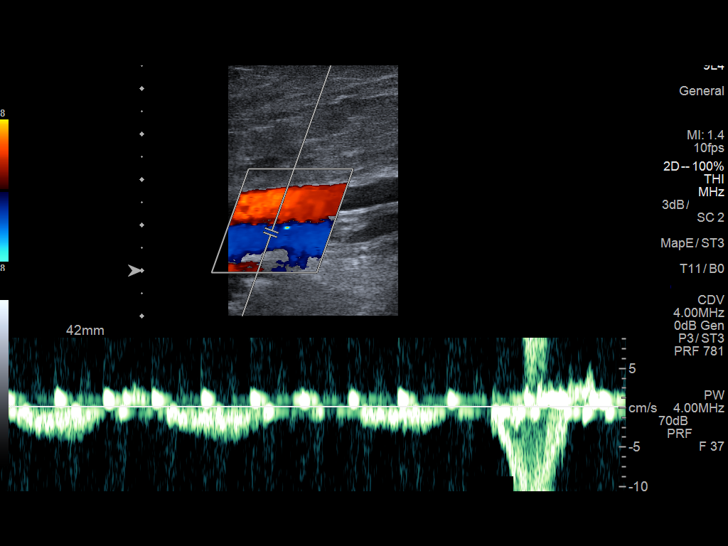
[im 16/28]
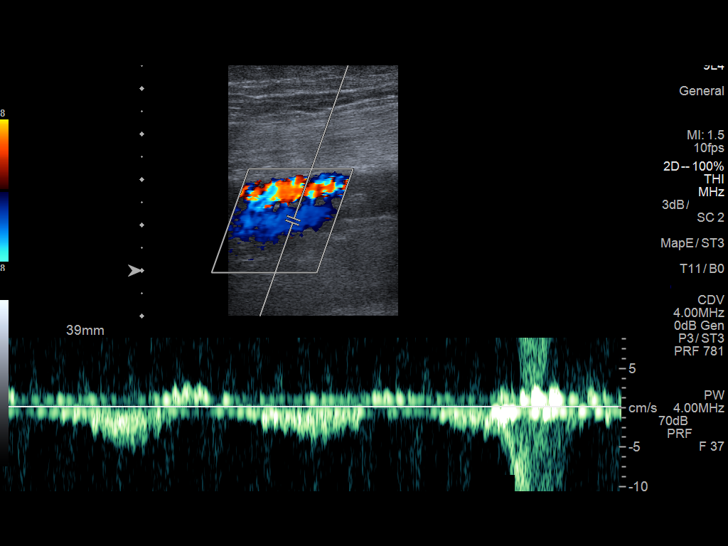
[im 18/28]
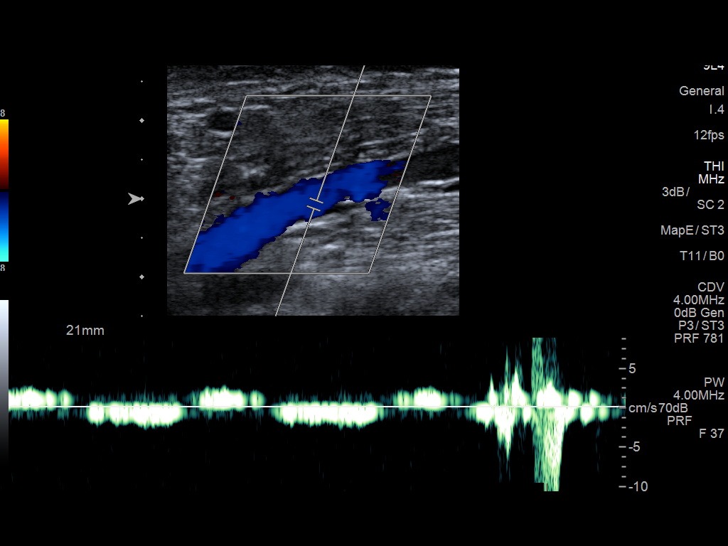
[im 20/28]
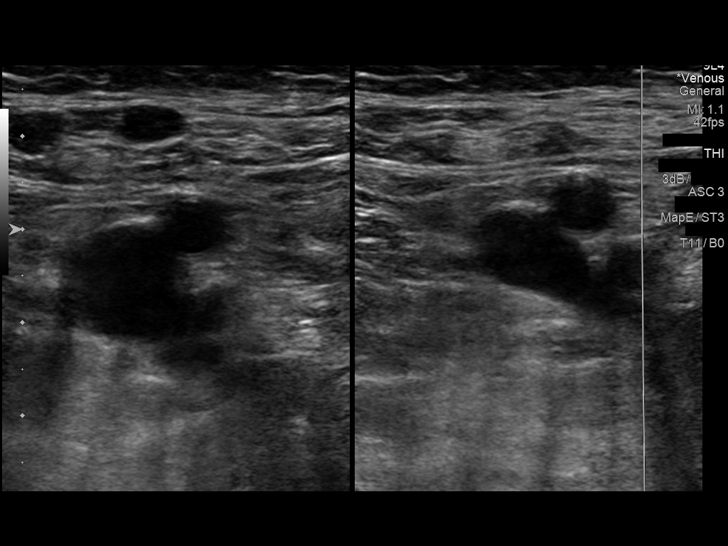
[im 23/28]
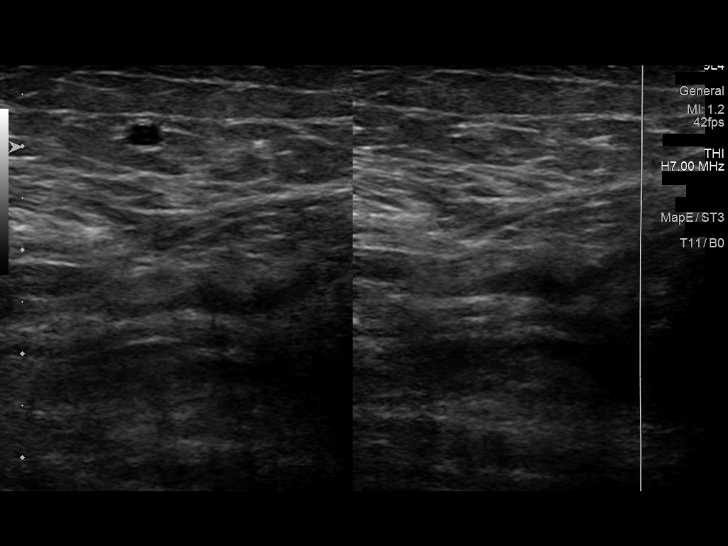
[im 25/28]
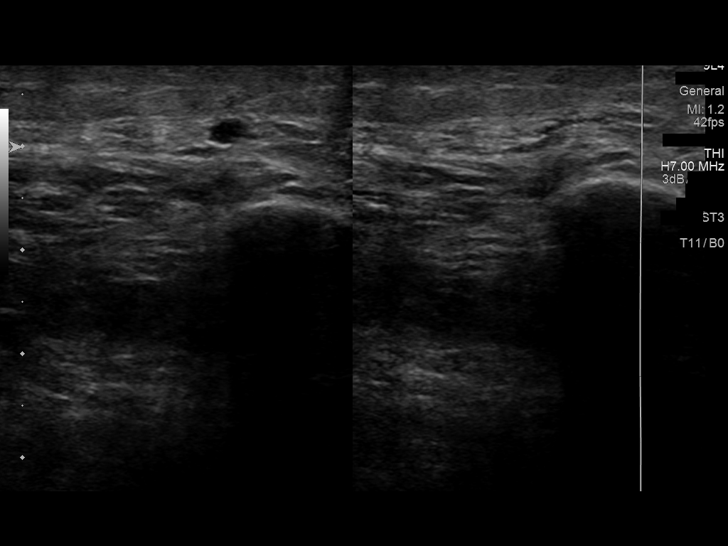
[im 28/28]
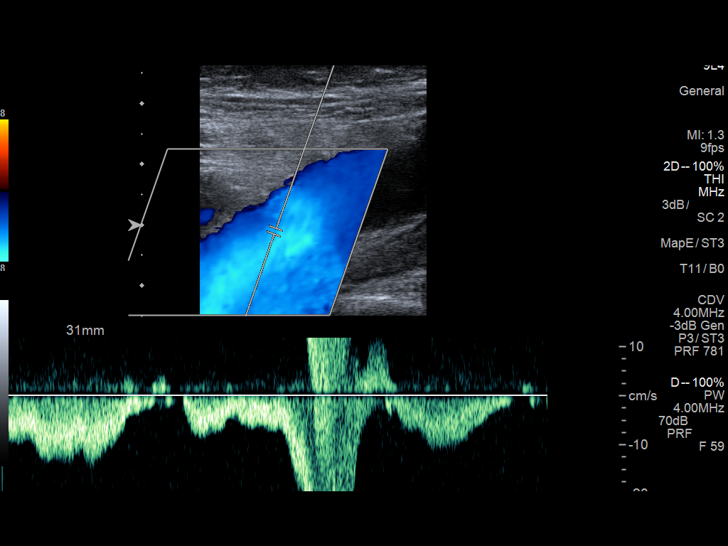

[13 of 24 positions shown; findings below may reference images not displayed]

FINDINGS: Contralateral Common Femoral Vein: Respiratory phasicity is normal
and symmetric with the symptomatic side. No evidence of thrombus.
Normal compressibility.

Common Femoral Vein: No evidence of thrombus. Normal
compressibility, respiratory phasicity and response to augmentation.

Saphenofemoral Junction: No evidence of thrombus. Normal
compressibility and flow on color Doppler imaging.

Profunda Femoral Vein: No evidence of thrombus. Normal
compressibility and flow on color Doppler imaging.

Femoral Vein: No evidence of thrombus. Normal compressibility,
respiratory phasicity and response to augmentation.

Popliteal Vein: No evidence of thrombus. Normal compressibility,
respiratory phasicity and response to augmentation.

Calf Veins: No evidence of thrombus. Normal compressibility and flow
on color Doppler imaging.

Superficial Great Saphenous Vein: No evidence of thrombus. Normal
compressibility.

Venous Reflux:  None.

Other Findings:  None.
IMPRESSION: No evidence of DVT within the left lower extremity.

## 2020-11-24 ENCOUNTER — Other Ambulatory Visit: Payer: Self-pay

## 2020-11-24 ENCOUNTER — Other Ambulatory Visit: Payer: Self-pay | Admitting: Family Medicine

## 2020-11-24 ENCOUNTER — Ambulatory Visit
Admission: RE | Admit: 2020-11-24 | Discharge: 2020-11-24 | Disposition: A | Discharge status: Home / Self Care | Payer: Managed Care, Other (non HMO) | Source: Ambulatory Visit | Attending: Family Medicine | Admitting: Family Medicine

## 2020-11-24 DIAGNOSIS — R102 Pelvic and perineal pain: Secondary | ICD-10-CM

## 2020-11-24 DIAGNOSIS — W19XXXA Unspecified fall, initial encounter: Secondary | ICD-10-CM

## 2021-10-14 IMAGING — CR DG PELVIS 1-2V
1 series · 1 of 1 positions shown · non-contrast
Comparison: None.

CLINICAL DATA: Fall, pelvic pain

EXAM:
PELVIS - 1-2 VIEW

[t pelvis a.p.]
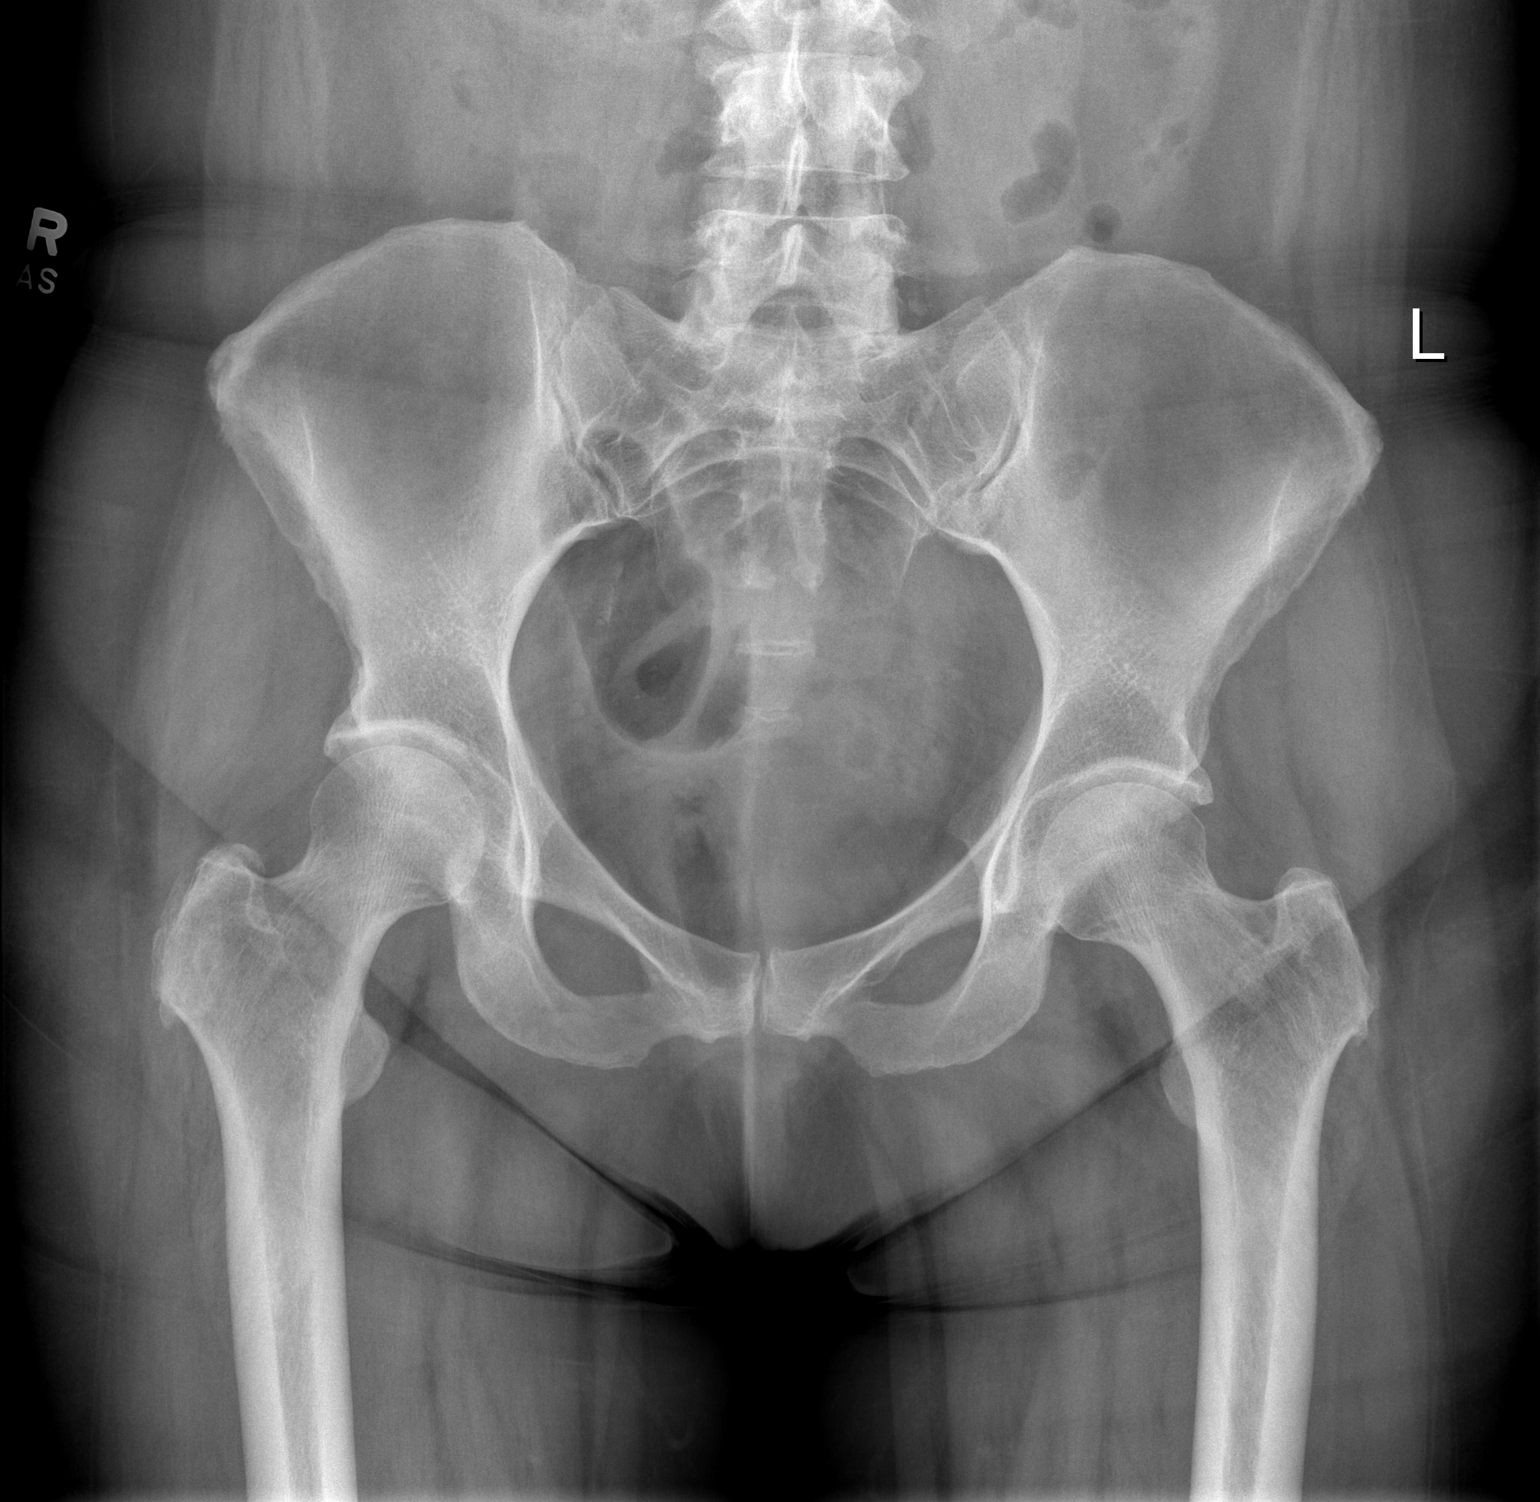

[1 of 1 positions shown; findings below may reference images not displayed]

FINDINGS: There is no evidence of pelvic fracture or diastasis. No pelvic bone
lesions are seen.
IMPRESSION: Negative.

## 2024-01-08 ENCOUNTER — Ambulatory Visit (INDEPENDENT_AMBULATORY_CARE_PROVIDER_SITE_OTHER)

## 2024-01-08 ENCOUNTER — Ambulatory Visit: Admitting: Podiatry

## 2024-01-08 ENCOUNTER — Encounter: Payer: Self-pay | Admitting: Podiatry

## 2024-01-08 DIAGNOSIS — M2012 Hallux valgus (acquired), left foot: Secondary | ICD-10-CM | POA: Diagnosis not present

## 2024-01-08 DIAGNOSIS — M21612 Bunion of left foot: Secondary | ICD-10-CM

## 2024-01-08 DIAGNOSIS — Q66222 Congenital metatarsus adductus, left foot: Secondary | ICD-10-CM

## 2024-01-10 ENCOUNTER — Telehealth: Payer: Self-pay | Admitting: Podiatry

## 2024-01-10 NOTE — Telephone Encounter (Signed)
 Pt called and was scheduled for surgery on 03/03/24 with Dr Lilian Kapur and she is postponing it until the week before thanksgiving(11/21) due to her work schedule. I changed it on the original order that was given

## 2024-01-13 ENCOUNTER — Encounter: Payer: Self-pay | Admitting: Podiatry

## 2024-01-13 NOTE — Progress Notes (Signed)
  Subjective:  Patient ID: Jo Hudson, female    DOB: [birth year],  MRN: [id number]  Chief Complaint  Patient presents with   Foot Pain    53 y.o. female presents with the above complaint. History confirmed with patient.  She has a bunion on her left foot that has been painful and worsening for some time.  She has considered surgery before but it was not terribly painful she was able to find shoes that fit her for some time but this no longer the case, it has become increasingly painful and is limiting her lifestyle and function  Objective:  Physical Exam: warm, good capillary refill, no trophic changes or ulcerative lesions, normal DP and PT pulses, normal sensory exam, and left foot hallux valgus with significant mobility at the first TMT joint good range of motion of the MTP joint   Radiographs: Multiple views x-ray of the left foot: Hallux valgus deformity she has metatarsus adductus that is compensated, large bunion medially, increase in the intermetatarsal angle Assessment:   1. Bunion of left foot   2. Hallux valgus with bunions, left   3. Metatarsus adductus of left foot      Plan:  Patient was evaluated and treated and all questions answered.  Discussed the etiology and treatment including surgical and non surgical treatment for painful bunions. She  has exhausted all non surgical treatment prior to this visit including shoe gear changes and padding. She desires surgical intervention. We discussed all risks including but not limited to: pain, swelling, infection, scar, numbness which may be temporary or permanent, chronic pain, stiffness, nerve pain or damage, wound healing problems, bone healing problems including delayed or non-union and recurrence. Specifically we discussed the following procedures: Lapidus bunionectomy with Akin osteotomy of the left foot and metatarsus adductus correction with osteotomy and fusion of the second and third tarsometatarsal joint and bone graft  from the heel. Informed consent was signed today. Surgery will be scheduled at a mutually agreeable date. Information regarding this will be forwarded to our surgery scheduler. In the interim until surgery I recommended utilizing as wide of shoes as possible, take NSAIDs or tylenol as tolerated for pain, and a bunion padding shield which can be purchased online.  Following visit she indicated she would like to wait until November for surgery.  I will see her back in late October for a preop visit and new x-rays  No follow-ups on file.

## 2024-07-29 ENCOUNTER — Ambulatory Visit (INDEPENDENT_AMBULATORY_CARE_PROVIDER_SITE_OTHER)

## 2024-07-29 ENCOUNTER — Ambulatory Visit: Admitting: Podiatry

## 2024-07-29 ENCOUNTER — Encounter: Payer: Self-pay | Admitting: Podiatry

## 2024-07-29 DIAGNOSIS — M2012 Hallux valgus (acquired), left foot: Secondary | ICD-10-CM

## 2024-07-29 DIAGNOSIS — M21962 Unspecified acquired deformity of left lower leg: Secondary | ICD-10-CM

## 2024-07-29 DIAGNOSIS — M2042 Other hammer toe(s) (acquired), left foot: Secondary | ICD-10-CM

## 2024-07-29 DIAGNOSIS — M21612 Bunion of left foot: Secondary | ICD-10-CM | POA: Diagnosis not present

## 2024-07-29 NOTE — Patient Instructions (Signed)
 In general the recovery process for your surgery will be:  - Immediately after surgery you will be in a walking boot but not putting pressure on it.  You will need a knee scooter for this time period. -After 2 to 3 weeks once your skin has healed and we have removed your sutures you may begin to put pressure on the foot in the boot for short amounts of time.  It will not tolerate long periods of time on the foot at this point but will gradually get better each week.  You may still need the knee scooter for longer distances here -Around 8 weeks as long as the bone is healing appropriately you should be able to transition back to a regular shoe at that point.  Please let me know if you have any other questions about the procedure or the recovery process.

## 2024-07-30 ENCOUNTER — Encounter: Payer: Self-pay | Admitting: Podiatry

## 2024-07-30 NOTE — Progress Notes (Signed)
  Subjective:  Patient ID: Jo Hudson, female    DOB: Feb 11, 1971,  MRN: [id number]  Chief Complaint  Patient presents with   Foot Problem    Rm 1 surgical consult with xrays for left foot bunion, resign consents    53 y.o. female presents with the above complaint. History confirmed with patient.  She returns for follow-up, changes in shoes anti-inflammatories have not helped with her pain in her foot.  She is scheduled for surgery in November.  Objective:  Physical Exam: warm, good capillary refill, no trophic changes or ulcerative lesions, normal DP and PT pulses, normal sensory exam, and left foot hallux valgus with significant mobility at the first TMT joint good range of motion of the MTP joint.  Prominent second metatarsal head plantar and 2nd and 5th hammertoe deformities  Radiographs: Multiple views x-ray of the left foot: Hallux valgus deformity she has metatarsus adductus that is compensated, large bunion medially, increase in the intermetatarsal angle, elongated second metatarsal and digital contractures of the 5th and 2nd toe Assessment:   1. Bunion of left foot   2. Hallux valgus with bunions, left   3. Hammertoe of left foot   4. Metatarsal deformity, left      Plan:  Patient was evaluated and treated and all questions answered.  Discussed the etiology and treatment including surgical and non surgical treatment for painful bunions and her hammertoe deformity. She  has exhausted all non surgical treatment prior to this visit including shoe gear changes and padding. She desires surgical intervention. We discussed all risks including but not limited to: pain, swelling, infection, scar, numbness which may be temporary or permanent, chronic pain, stiffness, nerve pain or damage, wound healing problems, bone healing problems including delayed or non-union and recurrence. Specifically we discussed the following procedures: Lapidus bunionectomy with Akin osteotomy of the left  foot, second hammertoe correction and second metatarsal osteotomy, fifth hammertoe correction. Informed consent was signed today. Surgery will be scheduled at a mutually agreeable date. Information regarding this will be forwarded to our surgery scheduler. In the interim until surgery I recommended utilizing as wide of shoes as possible, take NSAIDs or tylenol as tolerated for pain, and a bunion padding shield which can be purchased online.  Her metatarsal adductus deformity is mild and is compensated and off on today's films that I do not expect it will need to be addressed through fusion and osteotomy.   Surgical plan:  Procedure: - Left foot Lapidus bunionectomy, Akin osteotomy, second metatarsal osteotomy, 2nd and 5th hammertoe correction  Location: - [facility]  Anesthesia plan: - Sedation with regional block  Postoperative pain plan: - Tylenol 1000 mg every 6 hours, ibuprofen 600 mg every 6 hours, gabapentin 300 mg every 8 hours x5 days, oxycodone 5 mg 1-2 tabs every 6 hours only as needed  DVT prophylaxis: - None required  WB Restrictions / DME needs: - NWB in CAM boot with knee scooter   No follow-ups on file.

## 2024-08-11 ENCOUNTER — Ambulatory Visit: Admitting: Podiatry

## 2024-08-14 ENCOUNTER — Telehealth: Payer: Self-pay | Admitting: Podiatry

## 2024-08-14 NOTE — Telephone Encounter (Signed)
 DOS- 09/12/2024  AIKEN OSTEOTOMY LT- 71689 LAPIDUS PROCEDURE INCLUDING BUNIONECTOMY LT- 28297 METATARSAL OSTEOTOMY LT- 71691  [employer] EFFECTIVE DATE- 08/23/2021  DEDUCTIBLE- $1500 REMAINING- $1500 OOP- $4500 REMAINING- $4054.23 FAMILY DEDUCTIBLE- $3000 REMAINING- $3000 FAMILY OOP- $9000 REMAINING- $1640.76 COINSURANCE- 20%  PER AVAILITY PORTAL, NO PRIOR AUTHS ARE REQUIRED FOR CPT CODES 71689, 71702, AND 28308. DOCUMENTATION ATTACHED TO SURGERY CONSENT PACKET.

## 2024-09-02 ENCOUNTER — Telehealth: Payer: Self-pay | Admitting: Podiatry

## 2024-09-02 NOTE — Telephone Encounter (Signed)
 Called patient in regards to 09/12/2024 surgery date. Per the schedule and the surgery book the patient was to be scheduled at [facility]. When going through charts for upcoming surgeries discovered pt is supposed to be scheduled @ [facility]. Confirmed with Dr. Silva that there should be no reason why patient would need to be done at [facility]. Called and left message for patient as we are needing to reschedule her surgery as Dr. Silva will be at [facility] all day on 09/12/2024.

## 2024-09-02 NOTE — Telephone Encounter (Signed)
 Called and left 2nd message for patient to contact me back to discuss surgery date.

## 2024-09-08 ENCOUNTER — Other Ambulatory Visit: Payer: Self-pay | Admitting: Podiatry

## 2024-09-08 DIAGNOSIS — M2042 Other hammer toe(s) (acquired), left foot: Secondary | ICD-10-CM | POA: Diagnosis not present

## 2024-09-08 DIAGNOSIS — M2012 Hallux valgus (acquired), left foot: Secondary | ICD-10-CM | POA: Diagnosis not present

## 2024-09-08 DIAGNOSIS — M21612 Bunion of left foot: Secondary | ICD-10-CM | POA: Diagnosis not present

## 2024-09-08 MED ORDER — GABAPENTIN 300 MG PO CAPS: 300.0000 mg | ORAL_CAPSULE | Freq: Three times a day (TID) | ORAL | 0 refills | Status: DC

## 2024-09-08 MED ORDER — ACETAMINOPHEN 500 MG PO TABS: 1000.0000 mg | ORAL_TABLET | Freq: Four times a day (QID) | ORAL | 0 refills | Status: AC | PRN

## 2024-09-08 MED ORDER — IBUPROFEN 600 MG PO TABS: 600.0000 mg | ORAL_TABLET | Freq: Three times a day (TID) | ORAL | 0 refills | Status: AC | PRN

## 2024-09-08 MED ORDER — OXYCODONE HCL 5 MG PO TABS: 5.0000 mg | ORAL_TABLET | ORAL | 0 refills | Status: DC | PRN

## 2024-09-08 MED ORDER — ASPIRIN 81 MG PO TBEC: 81.0000 mg | DELAYED_RELEASE_TABLET | Freq: Two times a day (BID) | ORAL | 0 refills | Status: AC

## 2024-09-12 ENCOUNTER — Other Ambulatory Visit: Payer: Self-pay | Admitting: Podiatry

## 2024-09-12 ENCOUNTER — Telehealth: Payer: Self-pay

## 2024-09-12 MED ORDER — OXYCODONE HCL 5 MG PO TABS: 5.0000 mg | ORAL_TABLET | ORAL | 0 refills | Status: AC | PRN

## 2024-09-12 NOTE — Addendum Note (Signed)
 Addended byBETHA MEDICINE, Larnce Schnackenberg R on: 09/12/2024 01:26 PM   Modules accepted: Orders

## 2024-09-12 NOTE — Telephone Encounter (Signed)
Thank you Dr. McDonald.

## 2024-09-12 NOTE — Telephone Encounter (Signed)
 Patient called and left a message -asking for a refill on oxycodone  - she has only 6 left and will need more before her next appointment. [phone]

## 2024-09-15 ENCOUNTER — Telehealth: Payer: Self-pay

## 2024-09-15 NOTE — Telephone Encounter (Signed)
 PA request received from [facility] for Oxycodone  HCl 5 mg tablet. PA submitted through CoverMyMeds and waiting on response.  SHERI GATCHEL  (Key: [id number]) Rx #: [id number]

## 2024-09-16 ENCOUNTER — Encounter: Admitting: Podiatry

## 2024-09-16 ENCOUNTER — Encounter: Payer: Self-pay | Admitting: Podiatry

## 2024-09-16 ENCOUNTER — Ambulatory Visit (INDEPENDENT_AMBULATORY_CARE_PROVIDER_SITE_OTHER): Admitting: Podiatry

## 2024-09-16 ENCOUNTER — Ambulatory Visit (INDEPENDENT_AMBULATORY_CARE_PROVIDER_SITE_OTHER)

## 2024-09-16 DIAGNOSIS — M2012 Hallux valgus (acquired), left foot: Secondary | ICD-10-CM

## 2024-09-16 DIAGNOSIS — M21612 Bunion of left foot: Secondary | ICD-10-CM

## 2024-09-16 DIAGNOSIS — M2042 Other hammer toe(s) (acquired), left foot: Secondary | ICD-10-CM

## 2024-09-16 MED ORDER — CEPHALEXIN 500 MG PO CAPS: 500.0000 mg | ORAL_CAPSULE | Freq: Three times a day (TID) | ORAL | 0 refills | Status: DC

## 2024-09-16 NOTE — Progress Notes (Signed)
  Subjective:  Patient ID: Chatara Lucente, female    DOB: 1971/10/08,  MRN: [id number]  Chief Complaint  Patient presents with   Routine Post Op    POV # 1 DOS 09/08/24 LT KATRINA OSTEOTOMY, LT LAPIDUS PROCEDURE W BUNIONECTOMY, LT METATARSAL OSTEOTOMY, LT HAMMERTOE REPAIR x 2 It's okay, I guess.  It hurts, gravity hurts it.  My pain level is a five.    53 y.o. female returns for post-op check.  No other issues she received her pain medication refill, took some Aleve as well and this worked better than the ibuprofen  for her.  Review of Systems: Negative except as noted in the HPI. Denies N/V/F/Ch.   Objective:  There were no vitals filed for this visit. There is no height or weight on file to calculate BMI. Constitutional Well developed. Well nourished.  Vascular Foot warm and well perfused. Capillary refill normal to all digits.  Calf is soft and supple, no posterior calf or knee pain, negative Homans' sign  Neurologic Normal speech. Oriented to person, place, and time. Epicritic sensation to light touch grossly present bilaterally.  Dermatologic Incision is well coapted she has moderate edema and erythema around the distal forefoot.  No active drainage or dehiscence  Orthopedic: Tenderness to palpation noted about the surgical site.  Moderate edema   Multiple view plain film radiographs: Good correction noted all hardware intact in the immediate postoperative films Assessment:   1. Hallux valgus with bunions, left   2. Hammertoe of left foot    Plan:  Patient was evaluated and treated and all questions answered.  S/p foot surgery left -Progressing as expected post-operatively.  Did have some erythema likely expect this to be inflammatory due to amount of significant edema that she has, I did prescribe her Keflex  as a precaution -XR: Noted above -WB Status: Nonweightbearing in CAM boot -Sutures: Return as scheduled for suture removal. -Medications: Keflex  sent to pharmacy,  does not need refill of oxycodone  at this time she may use OTC Aleve I discussed with her she can use 440 mg twice daily as needed in place of the ibuprofen  -Foot redressed.  No follow-ups on file.

## 2024-09-17 NOTE — Telephone Encounter (Signed)
 PA was approved 09/15/24-10/15/24

## 2024-09-22 ENCOUNTER — Other Ambulatory Visit: Payer: Self-pay | Admitting: Podiatry

## 2024-09-22 ENCOUNTER — Encounter: Admitting: Podiatry

## 2024-09-30 ENCOUNTER — Ambulatory Visit: Admitting: Podiatry

## 2024-09-30 DIAGNOSIS — M2012 Hallux valgus (acquired), left foot: Secondary | ICD-10-CM

## 2024-09-30 DIAGNOSIS — M2042 Other hammer toe(s) (acquired), left foot: Secondary | ICD-10-CM

## 2024-09-30 MED ORDER — SUZETRIGINE 50 MG PO TABS: 1.0000 | ORAL_TABLET | ORAL | 0 refills | Status: AC

## 2024-09-30 NOTE — Progress Notes (Unsigned)
 Patient presents for post-op visit today, POV # 2 DOS 09/08/24 LT KATRINA OSTEOTOMY, LT LAPIDUS PROCEDURE W BUNIONECTOMY, LT METATARSAL OSTEOTOMY, LT HAMMERTOE REPAIR x 2  Here lately it has been incredibly achey. Nothing helps with the ache, very deep. Interrupting my sleep. Seems like it is all day. Elevating it does not help, I have not been taking my pain medication. I cannot take pain meds at night that might make me sleep because I have to be up at 8 am..  RN Notes: insurance did not approve the second round of gabapentin .   Vital Signs: Today's Vitals   09/30/24 1057  PainSc: 5   PainLoc: Foot      Radiographs: []  Taken [x]  Not taken  Surgical Site Assessment:  - Dressing:  [x]  Minimal dry blood, intact []  Reinforced   []  Changed     -RN Notes: n/a  - Incision:  [x]  CDI (clean, dry, intact)  []  Mild erythema  []  Drainage noted   -RN Notes: n/a  - Swelling:  []  None  []  Mild  [x]  Moderate   []  Significant     -RN Notes: n/a  - Bruising:  []  None  [x]  Present: across top of foot.    - Sutures/Staples:  []  None [x]  Intact  [x]  Removed Today  []  Plan to remove at next visit     -RN Notes: steri-strips removed today, some superficial skin came off with steri-strips.   -Cast/Splint/Pins: [x]  None []  Intact []  Removed Today []  Plan to remove at next visit []  Replaced  -Signs of infection:  [x]  None  []  Present - Describe: n/a  -DME:    []  None [x]  AFW []  Surgical shoe []  Cast  []  Splint  -Walking status:  [x]  Full WB  []  Partial WB  []  NWB  -Utilizing device:  []  None []  Knee Scooter [x]  Crutches [x]  Wheelchair    DVT assessment:  [x]  Denies symptoms []  Chest pain/SOB []  Pain in calf/redness/warmth   Redressed DSD and ace wrap. Educated on signs of infection, proper dressing care, pain management, and weight bearing status. Patient will contact provider with any new or worsening symptoms. The provider assessed the patient today and reviewed instructions regarding plan  of care.

## 2024-10-06 ENCOUNTER — Encounter: Admitting: Podiatry

## 2024-10-07 ENCOUNTER — Encounter: Admitting: Podiatry

## 2024-10-21 ENCOUNTER — Ambulatory Visit (INDEPENDENT_AMBULATORY_CARE_PROVIDER_SITE_OTHER): Admitting: Podiatry

## 2024-10-21 ENCOUNTER — Ambulatory Visit (INDEPENDENT_AMBULATORY_CARE_PROVIDER_SITE_OTHER)

## 2024-10-21 VITALS — Ht 65.0 in | Wt 185.0 lb

## 2024-10-21 DIAGNOSIS — M21612 Bunion of left foot: Secondary | ICD-10-CM

## 2024-10-21 DIAGNOSIS — M2012 Hallux valgus (acquired), left foot: Secondary | ICD-10-CM

## 2024-10-21 MED ORDER — MUPIROCIN 2 % EX OINT: 1.0000 | TOPICAL_OINTMENT | Freq: Every day | CUTANEOUS | 2 refills | Status: AC

## 2024-10-21 MED ORDER — CEPHALEXIN 500 MG PO CAPS: 500.0000 mg | ORAL_CAPSULE | Freq: Three times a day (TID) | ORAL | 0 refills | Status: AC

## 2024-10-22 NOTE — Progress Notes (Signed)
"  °  Subjective:  Patient ID: Jo Hudson, female    DOB: [birth year],  MRN: [id number]  Chief Complaint  Patient presents with   Post-op Follow-up    Rm 1 POV # 3 DOS 09/08/24  LT AIKEN OSTEOTOMY, LT LAPIDUS PROCEDURE W BUNIONECTOMY, LT METATARSAL OSTEOTOMY, LT HAMMERTOE REPAIR x 2. Pt states regular pain, discomfort and swelling. Pain is described as a 26/10.     53 y.o. female returns for post-op check.  She returns for follow-up notes some drainage from the incision  Review of Systems: Negative except as noted in the HPI. Denies N/V/F/Ch.   Objective:  There were no vitals filed for this visit. Body mass index is 30.79 kg/m. Constitutional Well developed. Well nourished.  Vascular Foot warm and well perfused. Capillary refill normal to all digits.  Calf is soft and supple, no posterior calf or knee pain, negative Homans' sign  Neurologic Normal speech. Oriented to person, place, and time. Epicritic sensation to light touch grossly present bilaterally.  Dermatologic There is delayed healing on the primary incision, there is some periwound erythema, no ascending cellulitis or heat to palpation  Orthopedic: No tenderness or pain to palpation noted about the surgical site.  Moderate edema   Multiple view plain film radiographs: Correction is maintained she has near complete bone healing across her fusion and osteotomy sites Assessment:   1. Hallux valgus with bunions, left    Plan:  Patient was evaluated and treated and all questions answered.  S/p foot surgery left Bone appears to be healing well she does have delayed soft tissue healing of the main incision I recommended applying mupirocin ointment and bandaging daily she has not applied much to it at this point.  There is no cellulitis or heat to palpation but there is some periwound erythema I recommended a course of Keflex  and this was sent to her pharmacy as well return precautions noted and discussed for any sign of worsening  infection I will see her back in 6 weeks to reevaluate or sooner if there is a further issues with the incision is slow to heal.  Return in about 6 weeks (around 12/02/2024) for post op (new x-rays).  "

## 2024-10-28 ENCOUNTER — Encounter: Admitting: Podiatry

## 2024-12-04 ENCOUNTER — Encounter: Admitting: Podiatry
